# Patient Record
Sex: Female | Born: 1975 | Race: Asian | Hispanic: No | Marital: Married | Smoking: Never smoker
Health system: Southern US, Community
[De-identification: ages and names within clinical notes are randomized; demographics above are authoritative.]

## PROBLEM LIST (undated history)

## (undated) DIAGNOSIS — I10 Essential (primary) hypertension: Secondary | ICD-10-CM

## (undated) HISTORY — PX: OTHER SURGICAL HISTORY: SHX169

## (undated) HISTORY — DX: Essential (primary) hypertension: I10

---

## 2018-08-08 ENCOUNTER — Ambulatory Visit (HOSPITAL_COMMUNITY)
Admission: EM | Admit: 2018-08-08 | Discharge: 2018-08-08 | Disposition: A | Discharge status: Home / Self Care | Payer: Commercial Managed Care - PPO | Attending: Family Medicine | Admitting: Family Medicine

## 2018-08-08 ENCOUNTER — Other Ambulatory Visit: Payer: Self-pay

## 2018-08-08 ENCOUNTER — Encounter (HOSPITAL_COMMUNITY): Payer: Self-pay

## 2018-08-08 DIAGNOSIS — B356 Tinea cruris: Secondary | ICD-10-CM | POA: Diagnosis not present

## 2018-08-08 MED ORDER — FLUCONAZOLE 200 MG PO TABS: 200.0000 mg | ORAL_TABLET | ORAL | 0 refills | Status: AC

## 2018-08-08 MED ORDER — CLOTRIMAZOLE 1 % EX CREA: TOPICAL_CREAM | CUTANEOUS | 0 refills | Status: DC

## 2018-08-08 NOTE — Discharge Instructions (Signed)
Please take Diflucan once weekly for the next month Continue Lotrimin twice daily  Please keep area clean and dry  Follow-up if symptoms changing worsening, not improving with the above

## 2018-08-08 NOTE — ED Provider Notes (Signed)
[facility]    CSN: [id number] Arrival date & time: 08/08/18  1433     History   Chief Complaint Chief Complaint  Patient presents with  . Rash    HPI Alicia Wilkins is a 43 y.o. female no significant past medical history presenting today for evaluation of rash.  Patient states that she has had a rash around her groin area off and on for many years.  She states that over the past 2 months she has had worsening symptoms.  Rash is associated with itching and a burning discomfort.  She has been using Lotrimin topically without relief.  She notes that she previously was on fungal medicine.  She denies any fevers.  Denies dysuria or increased frequency.  Denies abnormal vaginal discharge.  Denies fevers, chills or body aches.  Denies rash elsewhere on body.  HPI  History reviewed. No pertinent past medical history.  There are no active problems to display for this patient.   Past Surgical History:  Procedure Laterality Date  . c-section      OB History   No obstetric history on file.      Home Medications    Prior to Admission medications   Medication Sig Start Date End Date Taking? Authorizing Provider  clotrimazole (LOTRIMIN) 1 % cream Apply to affected area 2 times daily 08/08/18   Arlone Lenhardt C, PA-C  fluconazole (DIFLUCAN) 200 MG tablet Take 1 tablet (200 mg total) by mouth once a week for 4 doses. 08/08/18 08/30/18  Shamica Moree, Junius CreamerHallie C, PA-C    Family History No family history on file.  Social History Social History   Tobacco Use  . Smoking status: Never Smoker  . Smokeless tobacco: Never Used  Substance Use Topics  . Alcohol use: Not Currently  . Drug use: Not Currently     Allergies   Patient has no known allergies.   Review of Systems Review of Systems  Constitutional: Negative for fatigue and fever.  HENT: Negative for mouth sores.   Eyes: Negative for visual disturbance.  Respiratory: Negative for shortness of breath.    Cardiovascular: Negative for chest pain.  Gastrointestinal: Negative for abdominal pain, nausea and vomiting.  Genitourinary: Negative for genital sores.  Musculoskeletal: Negative for arthralgias and joint swelling.  Skin: Positive for color change and rash. Negative for wound.  Neurological: Negative for dizziness, weakness, light-headedness and headaches.     Physical Exam Triage Vital Signs ED Triage Vitals  Enc Vitals Group     BP 08/08/18 1500 (!) 148/104     Pulse Rate 08/08/18 1500 (!) 56     Resp 08/08/18 1500 18     Temp 08/08/18 1500 98.7 F (37.1 C)     Temp Source 08/08/18 1500 Oral     SpO2 08/08/18 1500 100 %     Weight --      Height --      Head Circumference --      Peak Flow --      Pain Score 08/08/18 1501 0     Pain Loc --      Pain Edu? --      Excl. in GC? --    No data found.  Updated Vital Signs BP (!) 148/104 (BP Location: Right Arm)   Pulse (!) 56   Temp 98.7 F (37.1 C) (Oral)   Resp 18   LMP 08/01/2018   SpO2 100%   Visual Acuity Right Eye Distance:   Left Eye Distance:  Bilateral Distance:    Right Eye Near:   Left Eye Near:    Bilateral Near:     Physical Exam Vitals signs and nursing note reviewed.  Constitutional:      Appearance: She is well-developed.     Comments: No acute distress  HENT:     Head: Normocephalic and atraumatic.     Nose: Nose normal.  Eyes:     Conjunctiva/sclera: Conjunctivae normal.  Neck:     Musculoskeletal: Neck supple.  Cardiovascular:     Rate and Rhythm: Normal rate.  Pulmonary:     Effort: Pulmonary effort is normal. No respiratory distress.  Abdominal:     General: There is no distension.  Musculoskeletal: Normal range of motion.  Skin:    General: Skin is warm and dry.     Comments: Bilateral groin, proximal thighs and buttock area with hyperpigmented discoloration, mild erythema.  Increased erythema noted in genital region on mons.  Neurological:     Mental Status: She is alert  and oriented to person, place, and time.      UC Treatments / Results  Labs (all labs ordered are listed, but only abnormal results are displayed) Labs Reviewed - No data to display  EKG None  Radiology No results found.  Procedures Procedures (including critical care time)  Medications Ordered in UC Medications - No data to display  Initial Impression / Assessment and Plan / UC Course  I have reviewed the triage vital signs and the nursing notes.  Pertinent labs & imaging results that were available during my care of the patient were reviewed by me and considered in my medical decision making (see chart for details).     X-ray suggestive of tinea.  Will treat as such with Diflucan weekly x1 month, given currently using Lotrimin without relief..  Continue Lotrimin topically.  Discussed keeping area clean and dry.  Avoiding moisture that may exacerbate symptoms.  Continue to monitor,Discussed strict return precautions. Patient verbalized understanding and is agreeable with plan.  Final Clinical Impressions(s) / UC Diagnoses   Final diagnoses:  Tinea cruris     Discharge Instructions     Please take Diflucan once weekly for the next month Continue Lotrimin twice daily  Please keep area clean and dry  Follow-up if symptoms changing worsening, not improving with the above   ED Prescriptions    Medication Sig Dispense Auth. Provider   fluconazole (DIFLUCAN) 200 MG tablet Take 1 tablet (200 mg total) by mouth once a week for 4 doses. 4 tablet Ashlye Oviedo C, PA-C   clotrimazole (LOTRIMIN) 1 % cream Apply to affected area 2 times daily 40 g Isaia Hassell C, PA-C     Controlled Substance Prescriptions [place] Controlled Substance Registry consulted? Not Applicable   Lew Dawes, [place] 08/08/18 1609

## 2018-08-08 NOTE — ED Triage Notes (Signed)
Pt c/o rash to rt side of vaginal area around to buttocks for past 70months. States hx of the same.

## 2018-11-11 ENCOUNTER — Ambulatory Visit (HOSPITAL_COMMUNITY)
Admission: EM | Admit: 2018-11-11 | Discharge: 2018-11-11 | Disposition: A | Discharge status: Home / Self Care | Payer: Commercial Managed Care - PPO | Attending: Family Medicine | Admitting: Family Medicine

## 2018-11-11 ENCOUNTER — Encounter (HOSPITAL_COMMUNITY): Payer: Self-pay

## 2018-11-11 ENCOUNTER — Other Ambulatory Visit: Payer: Self-pay

## 2018-11-11 DIAGNOSIS — B356 Tinea cruris: Secondary | ICD-10-CM | POA: Diagnosis not present

## 2018-11-11 MED ORDER — KETOCONAZOLE 2 % EX CREA: 1.0000 "application " | TOPICAL_CREAM | Freq: Every day | CUTANEOUS | 1 refills | Status: DC

## 2018-11-11 MED ORDER — FLUCONAZOLE 200 MG PO TABS: 200.0000 mg | ORAL_TABLET | ORAL | 0 refills | Status: AC

## 2018-11-11 MED ORDER — NYSTATIN 100000 UNIT/GM EX POWD: Freq: Two times a day (BID) | CUTANEOUS | 1 refills | Status: DC

## 2018-11-11 NOTE — Discharge Instructions (Addendum)
Take the diflucan once a week for 4 weeks Apply the ketoconazole to the rash daily for 4 weeks After this use the powder daily to try to prevent recurrence

## 2018-11-11 NOTE — ED Triage Notes (Signed)
Patient presents to Urgent Care with complaints of itchy/ burning rash in her genital region since a month ago. Patient reports she had the same in May, and was given a cream that helped.

## 2018-11-11 NOTE — ED Provider Notes (Signed)
[facility]    CSN: [id number] Arrival date & time: 11/11/18  1515      History   Chief Complaint Chief Complaint  Patient presents with  . Rash    HPI Alicia Wilkins is a 43 y.o. female.   HPI  Patient was seen for tinea cruris in May.  She was pleased with the treatment previously.  She is unhappy that the rash keeps coming back.  She would like refills of her prior medication.  History reviewed. No pertinent past medical history.  There are no active problems to display for this patient.   Past Surgical History:  Procedure Laterality Date  . c-section      OB History   No obstetric history on file.      Home Medications    Prior to Admission medications   Medication Sig Start Date End Date Taking? Authorizing Provider  clotrimazole (LOTRIMIN) 1 % cream Apply to affected area 2 times daily 08/08/18   Wieters, Hallie C, PA-C  fluconazole (DIFLUCAN) 200 MG tablet Take 1 tablet (200 mg total) by mouth once a week for 4 doses. 11/11/18 12/03/18  Eustace MooreNelson, Yvonne Sue, MD  ketoconazole (NIZORAL) 2 % cream Apply 1 application topically daily. 11/11/18   Eustace MooreNelson, Yvonne Sue, MD  nystatin (MYCOSTATIN/NYSTOP) powder Apply topically 2 (two) times daily. 11/11/18   Eustace MooreNelson, Yvonne Sue, MD    Family History Family History  Problem Relation Age of Onset  . Healthy Mother   . Healthy Father     Social History Social History   Tobacco Use  . Smoking status: Never Smoker  . Smokeless tobacco: Never Used  Substance Use Topics  . Alcohol use: Not Currently  . Drug use: Not Currently     Allergies   Patient has no known allergies.   Review of Systems Review of Systems  Constitutional: Negative for chills and fever.  HENT: Negative for ear pain and sore throat.   Eyes: Negative for pain and visual disturbance.  Respiratory: Negative for cough and shortness of breath.   Cardiovascular: Negative for chest pain and palpitations.  Gastrointestinal:  Negative for abdominal pain and vomiting.  Genitourinary: Negative for dysuria and hematuria.  Musculoskeletal: Negative for arthralgias and back pain.  Skin: Positive for rash. Negative for color change.  Neurological: Negative for seizures and syncope.  All other systems reviewed and are negative.    Physical Exam Triage Vital Signs ED Triage Vitals  Enc Vitals Group     BP 11/11/18 1603 130/79     Pulse Rate 11/11/18 1603 63     Resp 11/11/18 1603 17     Temp 11/11/18 1603 98.8 F (37.1 C)     Temp Source 11/11/18 1603 Oral     SpO2 11/11/18 1603 100 %     Weight --      Height --      Head Circumference --      Peak Flow --      Pain Score 11/11/18 1640 5     Pain Loc --      Pain Edu? --      Excl. in GC? --    No data found.  Updated Vital Signs BP 130/79 (BP Location: Right Arm)   Pulse 63   Temp 98.8 F (37.1 C) (Oral)   Resp 17   SpO2 100%   Physical Exam Constitutional:      General: She is not in acute distress.    Appearance: She  is well-developed.  HENT:     Head: Normocephalic and atraumatic.  Eyes:     Conjunctiva/sclera: Conjunctivae normal.     Pupils: Pupils are equal, round, and reactive to light.  Neck:     Musculoskeletal: Normal range of motion.  Cardiovascular:     Rate and Rhythm: Normal rate.  Pulmonary:     Effort: Pulmonary effort is normal. No respiratory distress.  Abdominal:     General: There is no distension.     Palpations: Abdomen is soft.     Comments: Under abdominal pannus and across groin there is erythematous rash with clearing center and satellite lesions consistent with tinea cruris  Musculoskeletal: Normal range of motion.  Skin:    General: Skin is warm and dry.  Neurological:     Mental Status: She is alert.      UC Treatments / Results  Labs (all labs ordered are listed, but only abnormal results are displayed) Labs Reviewed - No data to display  EKG   Radiology No results found.  Procedures  Procedures (including critical care time)  Medications Ordered in UC Medications - No data to display  Initial Impression / Assessment and Plan / UC Course  I have reviewed the triage vital signs and the nursing notes.  Pertinent labs & imaging results that were available during my care of the patient were reviewed by me and considered in my medical decision making (see chart for details).     Medicines refilled.  We will add powder to use in between spells to see if this helps prevent.  Discussed keeping area dry Final Clinical Impressions(s) / UC Diagnoses   Final diagnoses:  Tinea cruris     Discharge Instructions     Take the diflucan once a week for 4 weeks Apply the ketoconazole to the rash daily for 4 weeks After this use the powder daily to try to prevent recurrence   ED Prescriptions    Medication Sig Dispense Auth. Provider   ketoconazole (NIZORAL) 2 % cream Apply 1 application topically daily. 30 g Raylene Everts, MD   fluconazole (DIFLUCAN) 200 MG tablet Take 1 tablet (200 mg total) by mouth once a week for 4 doses. 4 tablet Raylene Everts, MD   nystatin (MYCOSTATIN/NYSTOP) powder Apply topically 2 (two) times daily. 30 g Raylene Everts, MD     Controlled Substance Prescriptions [place] Controlled Substance Registry consulted? Not Applicable   Raylene Everts, MD 11/11/18 [id number]

## 2018-12-16 ENCOUNTER — Encounter (HOSPITAL_COMMUNITY): Payer: Self-pay | Admitting: Emergency Medicine

## 2018-12-16 ENCOUNTER — Ambulatory Visit (HOSPITAL_COMMUNITY)
Admission: EM | Admit: 2018-12-16 | Discharge: 2018-12-16 | Disposition: A | Discharge status: Home / Self Care | Payer: Commercial Managed Care - PPO | Attending: Emergency Medicine | Admitting: Emergency Medicine

## 2018-12-16 ENCOUNTER — Other Ambulatory Visit: Payer: Self-pay

## 2018-12-16 ENCOUNTER — Ambulatory Visit (HOSPITAL_COMMUNITY)
Admission: EM | Admit: 2018-12-16 | Discharge: 2018-12-16 | Disposition: A | Discharge status: Home / Self Care | Payer: Self-pay

## 2018-12-16 DIAGNOSIS — R21 Rash and other nonspecific skin eruption: Secondary | ICD-10-CM | POA: Diagnosis not present

## 2018-12-16 MED ORDER — FLUCONAZOLE 200 MG PO TABS: 200.0000 mg | ORAL_TABLET | ORAL | 0 refills | Status: AC

## 2018-12-16 NOTE — ED Triage Notes (Signed)
PT reports itching and burning in genital area. PT was seen and treated for same a month ago.

## 2018-12-16 NOTE — ED Provider Notes (Signed)
[place]    CSN: [id number] Arrival date & time: 12/16/18  1823      History   Chief Complaint Chief Complaint  Patient presents with  . Rash    Female provider please    HPI Alicia Wilkins is a 43 y.o. female.   Patient presents with recurrent rash on her left buttock.  She was seen here and treated on 08/08/2018 and again on 11/11/2018 for this rash; she was treated with Diflucan, ketoconazole cream, and nystatin powder.  Patient states when she stops taking the Diflucan the rash returns.  She denies vaginal discharge, vaginal rash, pelvic pain, abdominal pain, back pain, fever, or other symptoms.  The history is provided by the patient.    History reviewed. No pertinent past medical history.  There are no active problems to display for this patient.   Past Surgical History:  Procedure Laterality Date  . c-section      OB History   No obstetric history on file.      Home Medications    Prior to Admission medications   Medication Sig Start Date End Date Taking? Authorizing Provider  clotrimazole (LOTRIMIN) 1 % cream Apply to affected area 2 times daily 08/08/18  Yes Wieters, Hallie C, PA-C  ketoconazole (NIZORAL) 2 % cream Apply 1 application topically daily. 11/11/18  Yes Raylene Everts, MD  nystatin (MYCOSTATIN/NYSTOP) powder Apply topically 2 (two) times daily. 11/11/18  Yes Raylene Everts, MD  fluconazole (DIFLUCAN) 200 MG tablet Take 1 tablet (200 mg total) by mouth once a week for 4 doses. 12/16/18 01/07/19  Sharion Balloon, NP    Family History Family History  Problem Relation Age of Onset  . Healthy Mother   . Healthy Father     Social History Social History   Tobacco Use  . Smoking status: Never Smoker  . Smokeless tobacco: Never Used  Substance Use Topics  . Alcohol use: Not Currently  . Drug use: Not Currently     Allergies   Patient has no known allergies.   Review of Systems Review of Systems  Constitutional:  Negative for chills and fever.  HENT: Negative for ear pain and sore throat.   Eyes: Negative for pain and visual disturbance.  Respiratory: Negative for cough and shortness of breath.   Cardiovascular: Negative for chest pain and palpitations.  Gastrointestinal: Negative for abdominal pain and vomiting.  Genitourinary: Negative for dysuria and hematuria.  Musculoskeletal: Negative for arthralgias and back pain.  Skin: Positive for rash. Negative for color change.  Neurological: Negative for seizures and syncope.  All other systems reviewed and are negative.    Physical Exam Triage Vital Signs ED Triage Vitals  Enc Vitals Group     BP 12/16/18 1850 132/74     Pulse Rate 12/16/18 1848 (!) 51     Resp 12/16/18 1848 16     Temp 12/16/18 1848 97.6 F (36.4 C)     Temp Source 12/16/18 1848 Temporal     SpO2 12/16/18 1848 100 %     Weight --      Height --      Head Circumference --      Peak Flow --      Pain Score 12/16/18 1846 4     Pain Loc --      Pain Edu? --      Excl. in [place]? --    No data found.  Updated Vital Signs BP 132/74  Pulse (!) 51   Temp 97.6 F (36.4 C) (Temporal)   Resp 16   LMP 12/07/2018   SpO2 100%   Visual Acuity Right Eye Distance:   Left Eye Distance:   Bilateral Distance:    Right Eye Near:   Left Eye Near:    Bilateral Near:     Physical Exam Vitals signs and nursing note reviewed.  Constitutional:      General: She is not in acute distress.    Appearance: She is well-developed.  HENT:     Head: Normocephalic and atraumatic.     Mouth/Throat:     Mouth: Mucous membranes are moist.     Pharynx: Oropharynx is clear.  Eyes:     Conjunctiva/sclera: Conjunctivae normal.  Neck:     Musculoskeletal: Neck supple.  Cardiovascular:     Rate and Rhythm: Normal rate and regular rhythm.     Heart sounds: No murmur.  Pulmonary:     Effort: Pulmonary effort is normal. No respiratory distress.     Breath sounds: Normal breath sounds.   Abdominal:     General: Bowel sounds are normal.     Palpations: Abdomen is soft.     Tenderness: There is no abdominal tenderness. There is no right CVA tenderness, left CVA tenderness, guarding or rebound.  Skin:    General: Skin is warm and dry.     Findings: Rash present.     Comments: Faint erythematous circular rash on left buttock and left upper posterior thigh.  No drainage or streaks.   Neurological:     General: No focal deficit present.     Mental Status: She is alert and oriented to person, place, and time.      UC Treatments / Results  Labs (all labs ordered are listed, but only abnormal results are displayed) Labs Reviewed - No data to display  EKG   Radiology No results found.  Procedures Procedures (including critical care time)  Medications Ordered in UC Medications - No data to display  Initial Impression / Assessment and Plan / UC Course  I have reviewed the triage vital signs and the nursing notes.  Pertinent labs & imaging results that were available during my care of the patient were reviewed by me and considered in my medical decision making (see chart for details).    Rash on left buttock and left upper posterior thigh.  Treating with Diflucan weekly x4 weeks.  Instructed patient to continue with the nystatin powder and ketoconazole cream.  Instructed patient to follow-up with a dermatologist since the rash is recurrent.  Also instructed her to establish a primary care provider and suggestion given.  Patient agrees to plan of care.     Final Clinical Impressions(s) / UC Diagnoses   Final diagnoses:  Rash     Discharge Instructions     Take the prescribed Diflucan once a week for 4 weeks.  Call the dermatologist listed below or your preferred dermatologist to schedule an appointment to be seen for this recurrent rash.    Establish a primary care provider such as the one listed below.    ED Prescriptions    Medication Sig Dispense Auth.  Provider   fluconazole (DIFLUCAN) 200 MG tablet Take 1 tablet (200 mg total) by mouth once a week for 4 doses. 4 tablet Mickie Bail, NP     PDMP not reviewed this encounter.   Mickie Bail, NP 12/16/18 [phone]

## 2018-12-16 NOTE — Discharge Instructions (Addendum)
Take the prescribed Diflucan once a week for 4 weeks.  Call the dermatologist listed below or your preferred dermatologist to schedule an appointment to be seen for this recurrent rash.    Establish a primary care provider such as the one listed below.

## 2019-01-27 ENCOUNTER — Telehealth: Payer: Self-pay

## 2019-01-27 NOTE — Telephone Encounter (Signed)
NOTES ON FILE FROM [facility] [phone], SENT REFERRAL TO SCHEDULING

## 2019-01-30 ENCOUNTER — Emergency Department (HOSPITAL_COMMUNITY)
Admission: EM | Admit: 2019-01-30 | Discharge: 2019-01-31 | Disposition: A | Discharge status: Home / Self Care | Payer: Commercial Managed Care - PPO | Attending: Emergency Medicine | Admitting: Emergency Medicine

## 2019-01-30 ENCOUNTER — Other Ambulatory Visit: Payer: Self-pay

## 2019-01-30 DIAGNOSIS — Z79899 Other long term (current) drug therapy: Secondary | ICD-10-CM | POA: Diagnosis not present

## 2019-01-30 DIAGNOSIS — H81399 Other peripheral vertigo, unspecified ear: Secondary | ICD-10-CM

## 2019-01-30 DIAGNOSIS — R110 Nausea: Secondary | ICD-10-CM

## 2019-01-30 LAB — COMPREHENSIVE METABOLIC PANEL
ALT: 17 U/L (ref 0–44)
AST: 25 U/L (ref 15–41)
Albumin: 4.1 g/dL (ref 3.5–5.0)
Alkaline Phosphatase: 74 U/L (ref 38–126)
Anion gap: 9 (ref 5–15)
BUN: 9 mg/dL (ref 6–20)
CO2: 25 mmol/L (ref 22–32)
Calcium: 9.3 mg/dL (ref 8.9–10.3)
Chloride: 107 mmol/L (ref 98–111)
Creatinine, Ser: 0.78 mg/dL (ref 0.44–1.00)
GFR calc Af Amer: >60 mL/min (ref >60)
GFR calc non Af Amer: >60 mL/min (ref >60)
Glucose, Bld: 112 mg/dL — ABNORMAL HIGH (ref 70–99)
Potassium: 3.8 mmol/L (ref 3.5–5.1)
Sodium: 141 mmol/L (ref 135–145)
Total Bilirubin: 0.5 mg/dL (ref 0.3–1.2)
Total Protein: 7.9 g/dL (ref 6.5–8.1)

## 2019-01-30 LAB — URINALYSIS, ROUTINE W REFLEX MICROSCOPIC
Bacteria, UA: NONE SEEN
Bilirubin Urine: NEGATIVE
Glucose, UA: NEGATIVE mg/dL
Ketones, ur: NEGATIVE mg/dL
Nitrite: NEGATIVE
Protein, ur: NEGATIVE mg/dL
RBC / HPF: >50 RBC/hpf — ABNORMAL HIGH (ref 0–5)
Specific Gravity, Urine: 1.015 (ref 1.005–1.030)
pH: 5 (ref 5.0–8.0)

## 2019-01-30 LAB — LIPASE, BLOOD: Lipase: 33 U/L (ref 11–51)

## 2019-01-30 LAB — CBC
HCT: 34.8 % — ABNORMAL LOW (ref 36.0–46.0)
Hemoglobin: 11.2 g/dL — ABNORMAL LOW (ref 12.0–15.0)
MCH: 27.7 pg (ref 26.0–34.0)
MCHC: 32.2 g/dL (ref 30.0–36.0)
MCV: 85.9 fL (ref 80.0–100.0)
Platelets: 262 10*3/uL (ref 150–400)
RBC: 4.05 MIL/uL (ref 3.87–5.11)
RDW: 13.3 % (ref 11.5–15.5)
WBC: 7.6 10*3/uL (ref 4.0–10.5)
nRBC: 0 % (ref 0.0–0.2)

## 2019-01-30 LAB — I-STAT BETA HCG BLOOD, ED (MC, WL, AP ONLY): I-stat hCG, quantitative: <5 m[IU]/mL (ref <5)

## 2019-01-30 MED ORDER — PANTOPRAZOLE SODIUM 40 MG PO TBEC: 40.0000 mg | DELAYED_RELEASE_TABLET | Freq: Every day | ORAL | 0 refills | Status: DC

## 2019-01-30 MED ORDER — PROMETHAZINE HCL 25 MG PO TABS: 25.0000 mg | ORAL_TABLET | Freq: Three times a day (TID) | ORAL | 0 refills | Status: DC | PRN

## 2019-01-30 NOTE — ED Triage Notes (Addendum)
Patient states she has been having nausea, she has been seen at [facility] last week and they dx her with GERD, they gave her Zofran but it does not work that well for her symptoms. She went to [facility] and got some Phenergan, but she is about out. She dies any vomiting or diarrhea. Does endorse some dizziness, she said she has been eating and drinking like normally.

## 2019-01-30 NOTE — ED Provider Notes (Signed)
[facility]-EMERGENCY DEPT Provider Note   CSN: [id number] Arrival date & time: 01/30/19  1918     History   Chief Complaint Chief Complaint  Patient presents with   Nausea    HPI Alicia Wilkins is a 43 y.o. female with history of GERD      HPI  Patient presents for 12 days of nausea that is intermittent, worse in the mornings and associated with vertigo. Patient denies diarrhea, fever, headache, vision changes, no changes in bowel movements.  Patient denies chest pain, increased thirst, increased urination, alcohol use, drug use, vomiting or cough.   Patient states that she feels nauseous typically in the morning for 1 to 2 hours and during that time will feel that the room is moving when she attempts to walk.  However patient states that over the past 2 days she has had vertigo that is preventing her from getting around her house. States she is currently symptom free. However when she looks left she has occasional symptoms.   Denies any recent illness, sick contact.   No past medical history on file.  There are no active problems to display for this patient.   Past Surgical History:  Procedure Laterality Date   c-section       OB History   No obstetric history on file.      Home Medications    Prior to Admission medications   Medication Sig Start Date End Date Taking? Authorizing Provider  ondansetron (ZOFRAN) 4 MG tablet Take 4 mg by mouth every 8 (eight) hours as needed for nausea or vomiting.   Yes [provider]  clotrimazole (LOTRIMIN) 1 % cream Apply to affected area 2 times daily Patient not taking: Reported on 01/30/2019 08/08/18   Wieters, Hallie C, PA-C  ketoconazole (NIZORAL) 2 % cream Apply 1 application topically daily. Patient not taking: Reported on 01/30/2019 11/11/18   Eustace MooreNelson, Yvonne Sue, MD  meclizine (ANTIVERT) 25 MG tablet Take 1 tablet (25 mg total) by mouth 3 (three) times daily as needed for  dizziness. 01/31/19   Gailen ShelterFondaw, Akeia Perot S, PA  nystatin (MYCOSTATIN/NYSTOP) powder Apply topically 2 (two) times daily. Patient not taking: Reported on 01/30/2019 11/11/18   Eustace MooreNelson, Yvonne Sue, MD  ondansetron (ZOFRAN) 4 MG tablet Take 1 tablet (4 mg total) by mouth every 6 (six) hours for 10 days. 01/31/19 02/10/19  Gailen ShelterFondaw, Locke Barrell S, PA  pantoprazole (PROTONIX) 40 MG tablet Take 1 tablet (40 mg total) by mouth daily for 10 days. 01/30/19 02/09/19  Gailen ShelterFondaw, Makenzy Krist S, PA  promethazine (PHENERGAN) 25 MG tablet Take 1 tablet (25 mg total) by mouth every 8 (eight) hours as needed for up to 10 days for nausea or vomiting. 01/30/19 02/09/19  Gailen ShelterFondaw, Todd Jelinski S, PA    Family History Family History  Problem Relation Age of Onset   Healthy Mother    Healthy Father     Social History Social History   Tobacco Use   Smoking status: Never Smoker   Smokeless tobacco: Never Used  Substance Use Topics   Alcohol use: Not Currently   Drug use: Not Currently     Allergies   Patient has no known allergies.   Review of Systems Review of Systems  Constitutional: Negative for chills and fever.  HENT: Negative for hearing loss.   Eyes: Negative for pain.  Respiratory: Negative for shortness of breath.   Cardiovascular: Negative for chest pain and leg swelling.  Gastrointestinal: Positive for nausea. Negative for abdominal  pain and vomiting.  Genitourinary: Negative for dysuria.  Musculoskeletal: Negative for myalgias.  Skin: Negative for rash.  Neurological: Positive for dizziness and light-headedness. Negative for headaches.     Physical Exam Updated Vital Signs BP (!) 155/98    Pulse (!) 58    Temp 98.2 F (36.8 C) (Oral)    Resp 18    LMP 01/29/2019    SpO2 100%   Physical Exam Vitals signs and nursing note reviewed.  Constitutional:      General: She is not in acute distress.    Appearance: She is not ill-appearing or diaphoretic.     Comments: Patient is well-appearing, no acute  distress, pleasant, answers all questions appropriately and follows commands  HENT:     Head: Normocephalic and atraumatic.     Nose: Nose normal.     Mouth/Throat:     Mouth: Mucous membranes are moist.  Eyes:     General: No scleral icterus.    Extraocular Movements: Extraocular movements intact.     Pupils: Pupils are equal, round, and reactive to light.     Comments: Leftward gaze induces nausea.  Very slight, leftward horizontal nystagmus with leftward gaze.  Neck:     Musculoskeletal: Normal range of motion. No neck rigidity.  Cardiovascular:     Rate and Rhythm: Normal rate and regular rhythm.     Pulses: Normal pulses.     Heart sounds: Normal heart sounds.     Comments: Regular rate and rhythm, no murmurs rubs or gallops  HR does not change with standing Pulmonary:     Effort: Pulmonary effort is normal. No respiratory distress.     Breath sounds: No wheezing.  Abdominal:     General: Abdomen is flat. Bowel sounds are normal.     Palpations: Abdomen is soft.     Tenderness: There is no abdominal tenderness.  Musculoskeletal:     Right lower leg: No edema.     Left lower leg: No edema.     Comments: Patient has 5/5 strength bilateral upper and lower extremities.   Skin:    General: Skin is warm and dry.     Capillary Refill: Capillary refill takes less than 2 seconds.     Comments: Patient is not pale or diaphoretic  Neurological:     Mental Status: She is alert. Mental status is at baseline.     Comments: Alert and oriented to self, place, time and event.  Speech is fluent, clear without dysarthria or dysphasia.  Strength 5/5 in upper/lower extremities  Sensation intact in upper/lower extremities  Normal gait. Normal finger-to-nose and feet tapping.  CN I not tested  CN II grossly intact visual fields bilaterally. Did not visualize posterior eye.   CN III, IV, VI PERRLA and EOMs intact bilaterally  CN V Intact sensation to sharp and light touch to the face  CN  VII facial movements symmetric  CN VIII not tested  CN IX, X no uvula deviation, symmetric rise of soft palate  CN XI 5/5 SCM and trapezius strength bilaterally  CN XII Midline tongue protrusion, symmetric L/R movements   Psychiatric:        Mood and Affect: Mood normal.        Behavior: Behavior normal.      ED Treatments / Results  Labs (all labs ordered are listed, but only abnormal results are displayed) Labs Reviewed  COMPREHENSIVE METABOLIC PANEL - Abnormal; Notable for the following components:  Result Value   Glucose, Bld 112 (*)    All other components within normal limits  CBC - Abnormal; Notable for the following components:   Hemoglobin 11.2 (*)    HCT 34.8 (*)    All other components within normal limits  URINALYSIS, ROUTINE W REFLEX MICROSCOPIC - Abnormal; Notable for the following components:   APPearance HAZY (*)    Hgb urine dipstick LARGE (*)    Leukocytes,Ua TRACE (*)    RBC / HPF >50 (*)    All other components within normal limits  LIPASE, BLOOD  I-STAT BETA HCG BLOOD, ED ([facility], [facility], [facility] ONLY)    EKG EKG Interpretation  Date/Time:  Saturday January 31 2019 00:13:54 EST Ventricular Rate:  54 PR Interval:    QRS Duration: 85 QT Interval:  437 QTC Calculation: 415 R Axis:   43 Text Interpretation: Sinus rhythm Baseline wander in lead(s) V4 No old tracing to compare Confirmed by Ward, Baxter Hire [phone]) on 01/31/2019 12:23:20 AM   Radiology No results found.  Procedures Procedures (including critical care time)  Medications Ordered in ED Medications - No data to display   Initial Impression / Assessment and Plan / ED Course  I have reviewed the triage vital signs and the nursing notes.  Pertinent labs & imaging results that were available during my care of the patient were reviewed by me and considered in my medical decision making (see chart for details).       Patient presents symptom-free but with symptoms of nausea and lightheadedness  that occurs intermittently worse in the mornings worsened by looking left.  She is well-appearing and has no symptoms currently.  Patient has normal vitals other than elevated blood pressure.  Patient has benign abdominal exam low concern for abdominal pathology causing her nausea.  Patient denies headache, fever, vertiginous symptoms, denies visual changes and has no focal neuro deficits--for this reason I doubt that there is a neurologic cause of patient's symptoms.   CBC and CMP abnormal finding to describe symptoms.  Lipase within normal limits.  Patient has negative hCG pregnancy test. UA with RBCs--patient with mildly low HCT likely due to patient currently on menstrual period.   No history of DM or historical features to indicate patient has diabetes.  Patient has any drug use doubt hyperemesis as a result of THC use.   Patient otherwise has reassuring physical exam and history.  Suspect that patient's nausea is due to peripheral vertigo potentially BPPV or vestibular neuritis; BPPV more likely as patient symptoms are intermittent and appear to be worse at times at night likely when she is moving to the affected side.  However because nausea is intermittent and mild and has been assessed by multiple providers will refill her Phenergan and encouraged her to follow-up with her PCP. Will also keep her cardiology appointment.  Patient given resources for ENT follow-up if symptoms persist.  Given strict return precautions.    I discussed this case with my attending physician Dr. Elesa Massed including patient's presenting symptoms, physical exam, and planned diagnostics and interventions. Attending physician stated agreement with plan or made changes to plan which were implemented. Dr. Elesa Massed recommended zofran over phenergan. Zofran prescribed. Discussed with patient not to fill prescription for phenergan.   Attending physician assessed patient at bedside.      The patient appears reasonably screened  and/or stabilized for discharge and I doubt any other medical condition or other [facility] requiring further screening, evaluation, or treatment in the ED at this  time prior to discharge.  Patient is hemodynamically stable, in NAD, and able to ambulate in the ED. Pain has been managed or a plan has been made for home management and has no complaints prior to discharge. Patient is comfortable with above plan and is stable for discharge at this time. All questions were answered prior to disposition. Results from the ER workup discussed with the patient face to face and all questions answered to the best of my ability. The patient is safe for discharge with strict return precautions. Patient appears safe for discharge with appropriate follow-up.  Conveyed my impression with the patient and he voiced understanding and is agreeable to plan.   An After Visit Summary was printed and given to the patient.  Portions of this note were generated with Scientist, clinical (histocompatibility and immunogenetics). Dictation errors may occur despite best attempts at proofreading.    Final Clinical Impressions(s) / ED Diagnoses   Final diagnoses:  Nausea without vomiting  Peripheral vertigo, unspecified laterality    ED Discharge Orders         Ordered    meclizine (ANTIVERT) 25 MG tablet  3 times daily PRN     01/31/19 0026    ondansetron (ZOFRAN) 4 MG tablet  Every 6 hours     01/31/19 0041    promethazine (PHENERGAN) 25 MG tablet  Every 8 hours PRN     01/30/19 2355    pantoprazole (PROTONIX) 40 MG tablet  Daily     01/30/19 2355           Solon Augusta Eloy, [place] 01/31/19 0115    Linwood Dibbles, MD 02/01/19 1123

## 2019-01-31 MED ORDER — MECLIZINE HCL 25 MG PO TABS: 25.0000 mg | ORAL_TABLET | Freq: Three times a day (TID) | ORAL | 0 refills | Status: DC | PRN

## 2019-01-31 MED ORDER — ONDANSETRON HCL 4 MG PO TABS: 4.0000 mg | ORAL_TABLET | Freq: Four times a day (QID) | ORAL | 0 refills | Status: AC

## 2019-01-31 NOTE — ED Provider Notes (Signed)
Medical screening examination/treatment/procedure(s) were conducted as a shared visit with non-physician practitioner(s) and myself.  I personally evaluated the patient during the encounter.  EKG Interpretation  Date/Time:  Saturday January 31 2019 00:13:54 EST Ventricular Rate:  54 PR Interval:    QRS Duration: 85 QT Interval:  437 QTC Calculation: 415 R Axis:   43 Text Interpretation: Sinus rhythm Baseline wander in lead(s) V4 No old tracing to compare Confirmed by Aviya Jarvie, Cyril Mourning [phone]) on 01/31/2019 12:23:20 AM  Patient is a 43 year old healthy female who presents to the emergency department with 2 weeks of feeling she is off balance, having spinning sensation, nausea without vomiting.  No chest pain, shortness of breath, abdominal pain, diarrhea.  No numbness or focal weakness.  Symptoms worse with looking to the left.  Zofran, Phenergan previously helping but still having symptoms.  Suspect peripheral vertigo.  Doubt CVA.  Recommended meclizine, Zofran, PCP follow-up.  If symptoms or not improving, will give ENT follow-up information.  Discussed with patient that this is usually a self-limiting process.   Jalayna Josten, Delice Bison, DO 01/31/19 [phone]

## 2019-01-31 NOTE — Discharge Instructions (Signed)
I refilled your Phenergan and Protonix.  Please take as prescribed.  Please follow-up with your primary care physician and keep your appointment with cardiology. You may follow up with otolaryngology if symptoms persist.

## 2019-02-12 ENCOUNTER — Other Ambulatory Visit: Payer: Self-pay | Admitting: Otolaryngology

## 2019-02-12 DIAGNOSIS — R42 Dizziness and giddiness: Secondary | ICD-10-CM

## 2019-02-12 DIAGNOSIS — R2689 Other abnormalities of gait and mobility: Secondary | ICD-10-CM

## 2019-02-12 DIAGNOSIS — R110 Nausea: Secondary | ICD-10-CM

## 2019-02-16 ENCOUNTER — Other Ambulatory Visit: Payer: Self-pay | Admitting: Family Medicine

## 2019-02-16 DIAGNOSIS — Z1231 Encounter for screening mammogram for malignant neoplasm of breast: Secondary | ICD-10-CM

## 2019-02-18 ENCOUNTER — Other Ambulatory Visit: Payer: Self-pay

## 2019-02-18 ENCOUNTER — Ambulatory Visit (INDEPENDENT_AMBULATORY_CARE_PROVIDER_SITE_OTHER): Payer: Commercial Managed Care - PPO | Admitting: Cardiovascular Disease

## 2019-02-18 ENCOUNTER — Encounter: Payer: Self-pay | Admitting: Cardiovascular Disease

## 2019-02-18 VITALS — BP 128/88 | HR 63 | Ht 65.0 in | Wt 178.2 lb

## 2019-02-18 DIAGNOSIS — R001 Bradycardia, unspecified: Secondary | ICD-10-CM | POA: Diagnosis not present

## 2019-02-18 DIAGNOSIS — R0602 Shortness of breath: Secondary | ICD-10-CM | POA: Diagnosis not present

## 2019-02-18 NOTE — Patient Instructions (Signed)
Medication Instructions:  No changes *If you need a refill on your cardiac medications before your next appointment, please call your pharmacy*  Lab Work: None ordered If you have labs (blood work) drawn today and your tests are completely normal, you will receive your results only by: Marland Kitchen MyChart Message (if you have MyChart) OR . A paper copy in the mail If you have any lab test that is abnormal or we need to change your treatment, we will call you to review the results.  Testing/Procedures: Your physician has requested that you have an echocardiogram. Echocardiography is a painless test that uses sound waves to create images of your heart. It provides your doctor with information about the size and shape of your heart and how well your heart's chambers and valves are working. You may receive an ultrasound enhancing agent through an IV if needed to better visualize your heart during the echo.This procedure takes approximately one hour. There are no restrictions for this procedure. This will take place at the [address].   ZIO XT- Long Term Monitor Instructions   Your physician has requested you wear your ZIO patch monitor_______days.   This is a single patch monitor.  [employer] supplies one patch monitor per enrollment.  Additional stickers are not available.   Please do not apply patch if you will be having a Nuclear Stress Test, Echocardiogram, Cardiac CT, MRI, or Chest Xray during the time frame you would be wearing the monitor. The patch cannot be worn during these tests.  You cannot remove and re-apply the ZIO XT patch monitor.   Your ZIO patch monitor will be sent USPS Priority mail from [facility] directly to your home address. The monitor may also be mailed to a PO BOX if home delivery is not available.   It may take 3-5 days to receive your monitor after you have been enrolled.   Once you have received you monitor, please review enclosed instructions.  Your  monitor has already been registered assigning a specific monitor serial # to you.   Applying the monitor   Shave hair from upper left chest.   Hold abrader disc by orange tab.  Rub abrader in 40 strokes over left upper chest as indicated in your monitor instructions.   Clean area with 4 enclosed alcohol pads .  Use all pads to assure are is cleaned thoroughly.  Let dry.   Apply patch as indicated in monitor instructions.  Patch will be place under collarbone on left side of chest with arrow pointing upward.   Rub patch adhesive wings for 2 minutes.Remove white label marked "1".  Remove white label marked "2".  Rub patch adhesive wings for 2 additional minutes.   While looking in a mirror, press and release button in center of patch.  A small green light will flash 3-4 times .  This will be your only indicator the monitor has been turned on.     Do not shower for the first 24 hours.  You may shower after the first 24 hours.   Press button if you feel a symptom. You will hear a small click.  Record Date, Time and Symptom in the Patient Log Book.   When you are ready to remove patch, follow instructions on last 2 pages of Patient Log Book.  Stick patch monitor onto last page of Patient Log Book.   Place Patient Log Book in [place] box.  Use locking tab on box  and tape box closed securely.  The [employer] and [employer] has [employer] on it.  Please place in mailbox as soon as possible.  Your physician should have your test results approximately 7 days after the monitor has been mailed back to [facility].   Call Violet at [phone] if you have questions regarding your ZIO XT patch monitor.  Call them immediately if you see an orange light blinking on your monitor.   If your monitor falls off in less than 4 days contact our Monitor department at [phone].  If your monitor becomes loose or falls off after 4 days call [employer] at [phone] for  suggestions on securing your monitor.    Follow-Up: At [facility], you and your health needs are our priority.  As part of our continuing mission to provide you with exceptional heart care, we have created designated Provider Care Teams.  These Care Teams include your primary Cardiologist (physician) and Advanced Practice Providers (APPs -  Physician Assistants and Nurse Practitioners) who all work together to provide you with the care you need, when you need it.  Your next appointment:   Follow up in 6-8 weeks with Ancil Linsey, MD

## 2019-02-18 NOTE — Assessment & Plan Note (Signed)
Alicia Wilkins is a 43 year old female referred to me by Dr. Sherrlyn Hock  for slow heart rate which was noticed while she was in the hospital for nausea and vomiting, and vertigo.  Her heart rate is 63 today.  I am going to get a 2-week Zio patch to further evaluate

## 2019-02-18 NOTE — Progress Notes (Signed)
02/18/2019 Alicia Wilkins   [birth year]  [id number]  Primary Physician System, Pcp Not In Primary Cardiologist: Alicia Gess MD Alicia Wilkins, [place], [place]  HPI:  Alicia Wilkins is a 43 y.o. thin appearing married female mother of 2 children who does not work and was referred by Dr. Celene Wilkins for slow heart rate.  She has no cardiac risk factors.  She is never had heart attack or stroke.  There is no family history of heart disease.  She was recently in the hospital for nausea vomiting and vertigo and apparently was noted to be bradycardic.  She is no longer bradycardic.  She does complain of some dyspnea on exertion however.   Current Meds  Medication Sig  . ketoconazole (NIZORAL) 2 % cream Apply 1 application topically daily.  . meclizine (ANTIVERT) 25 MG tablet Take 1 tablet (25 mg total) by mouth 3 (three) times daily as needed for dizziness.  . nystatin (MYCOSTATIN/NYSTOP) powder Apply topically 2 (two) times daily.  . ondansetron (ZOFRAN) 4 MG tablet Take 4 mg by mouth every 8 (eight) hours as needed for nausea or vomiting.  . promethazine (PHENERGAN) 25 MG tablet Take 1 tablet (25 mg total) by mouth every 8 (eight) hours as needed for up to 10 days for nausea or vomiting.     No Known Allergies  Social History   Socioeconomic History  . Marital status: Married    Spouse name: Not on file  . Number of children: Not on file  . Years of education: Not on file  . Highest education level: Not on file  Occupational History  . Not on file  Social Needs  . Financial resource strain: Not on file  . Food insecurity    Worry: Not on file    Inability: Not on file  . Transportation needs    Medical: Not on file    Non-medical: Not on file  Tobacco Use  . Smoking status: Never Smoker  . Smokeless tobacco: Never Used  Substance and Sexual Activity  . Alcohol use: Not Currently  . Drug use: Not Currently  . Sexual activity: Not on file  Lifestyle  .  Physical activity    Days per week: Not on file    Minutes per session: Not on file  . Stress: Not on file  Relationships  . Social Musician on phone: Not on file    Gets together: Not on file    Attends religious service: Not on file    Active member of club or organization: Not on file    Attends meetings of clubs or organizations: Not on file    Relationship status: Not on file  . Intimate partner violence    Fear of current or ex partner: Not on file    Emotionally abused: Not on file    Physically abused: Not on file    Forced sexual activity: Not on file  Other Topics Concern  . Not on file  Social History Narrative  . Not on file     Review of Systems: General: negative for chills, fever, night sweats or weight changes.  Cardiovascular: negative for chest pain, dyspnea on exertion, edema, orthopnea, palpitations, paroxysmal nocturnal dyspnea or shortness of breath Dermatological: negative for rash Respiratory: negative for cough or wheezing Urologic: negative for hematuria Abdominal: negative for nausea, vomiting, diarrhea, bright red blood per rectum, melena, or hematemesis Neurologic: negative for visual changes, syncope, or dizziness All other  systems reviewed and are otherwise negative except as noted above.    Blood pressure 128/88, pulse 63, height 5\' 5"  (1.651 m), weight 178 lb 3.2 oz (80.8 kg), last menstrual period 01/29/2019, SpO2 99 %.  General appearance: alert and no distress Neck: no adenopathy, no carotid bruit, no JVD, supple, symmetrical, trachea midline and thyroid not enlarged, symmetric, no tenderness/mass/nodules Lungs: clear to auscultation bilaterally Heart: regular rate and rhythm, S1, S2 normal, no murmur, click, rub or gallop Extremities: extremities normal, atraumatic, no cyanosis or edema Pulses: 2+ and symmetric Skin: Skin color, texture, turgor normal. No rashes or lesions Neurologic: Alert and oriented X 3, normal strength  and tone. Normal symmetric reflexes. Normal coordination and gait  EKG not performed today  ASSESSMENT AND PLAN:   Sinus bradycardia [name] is a 43 year old female referred to me by Dr. Sherrlyn Wilkins  for slow heart rate which was noticed while she was in the hospital for nausea and vomiting, and vertigo.  Her heart rate is 63 today.  I am going to get a 2-week Zio patch to further evaluate  Shortness of breath Patient complains of dyspnea exertion.  She has no cardiac risk factors.  She is never smoked.  We will get a 2D echo to further evaluate      Alicia Harp MD [facility], [facility] 02/18/2019 5:00 PM

## 2019-02-18 NOTE — Assessment & Plan Note (Signed)
Patient complains of dyspnea exertion.  She has no cardiac risk factors.  She is never smoked.  We will get a 2D echo to further evaluate

## 2019-02-26 ENCOUNTER — Ambulatory Visit (HOSPITAL_COMMUNITY): Payer: Commercial Managed Care - PPO | Attending: Cardiovascular Disease

## 2019-02-26 ENCOUNTER — Other Ambulatory Visit: Payer: Self-pay

## 2019-02-26 DIAGNOSIS — R0602 Shortness of breath: Secondary | ICD-10-CM | POA: Insufficient documentation

## 2019-02-27 ENCOUNTER — Telehealth: Payer: Self-pay | Admitting: Radiology

## 2019-02-27 NOTE — Telephone Encounter (Signed)
Enrolled patient for a 14 day Zio monitor to be mailed to patients home.  

## 2019-03-07 ENCOUNTER — Ambulatory Visit
Admission: RE | Admit: 2019-03-07 | Discharge: 2019-03-07 | Disposition: A | Discharge status: Home / Self Care | Payer: Commercial Managed Care - PPO | Source: Ambulatory Visit | Attending: Otolaryngology | Admitting: Otolaryngology

## 2019-03-07 ENCOUNTER — Other Ambulatory Visit: Payer: Self-pay

## 2019-03-07 DIAGNOSIS — R42 Dizziness and giddiness: Secondary | ICD-10-CM

## 2019-03-07 DIAGNOSIS — R110 Nausea: Secondary | ICD-10-CM

## 2019-03-07 DIAGNOSIS — R2689 Other abnormalities of gait and mobility: Secondary | ICD-10-CM

## 2019-03-07 MED ORDER — GADOBENATE DIMEGLUMINE 529 MG/ML IV SOLN
16.0000 mL | Freq: Once | INTRAVENOUS | Status: AC | PRN
  Administered 2019-03-07: 16 mL via INTRAVENOUS

## 2019-03-09 ENCOUNTER — Other Ambulatory Visit (INDEPENDENT_AMBULATORY_CARE_PROVIDER_SITE_OTHER): Payer: Commercial Managed Care - PPO

## 2019-03-09 DIAGNOSIS — R001 Bradycardia, unspecified: Secondary | ICD-10-CM | POA: Diagnosis not present

## 2019-04-10 ENCOUNTER — Ambulatory Visit
Admission: RE | Admit: 2019-04-10 | Discharge: 2019-04-10 | Disposition: A | Discharge status: Home / Self Care | Payer: Commercial Managed Care - PPO | Source: Ambulatory Visit | Attending: Family Medicine | Admitting: Family Medicine

## 2019-04-10 ENCOUNTER — Other Ambulatory Visit: Payer: Self-pay

## 2019-04-10 DIAGNOSIS — Z1231 Encounter for screening mammogram for malignant neoplasm of breast: Secondary | ICD-10-CM

## 2019-04-28 ENCOUNTER — Ambulatory Visit: Payer: Commercial Managed Care - PPO | Admitting: Cardiovascular Disease

## 2021-07-24 IMAGING — MR MR BRAIN/IAC WO/W CM
11 of 12 series · 38 of 48 positions shown · IV contrast (multihance)
Comparison: None.

CLINICAL DATA: Dizziness for 1 and half months.

EXAM:
MRI HEAD WITHOUT AND WITH CONTRAST
TECHNIQUE: Multiplanar, multiecho pulse sequences of the brain and surrounding
structures were obtained without and with intravenous contrast.
CONTRAST:  16mL MULTIHANCE GADOBENATE DIMEGLUMINE 529 MG/ML IV SOLN

[Series 2: T1 · sagittal · 5.0mm · 0.45mm/px · 4 of 23 slices shown (1 of 3)]
[im 1/23]
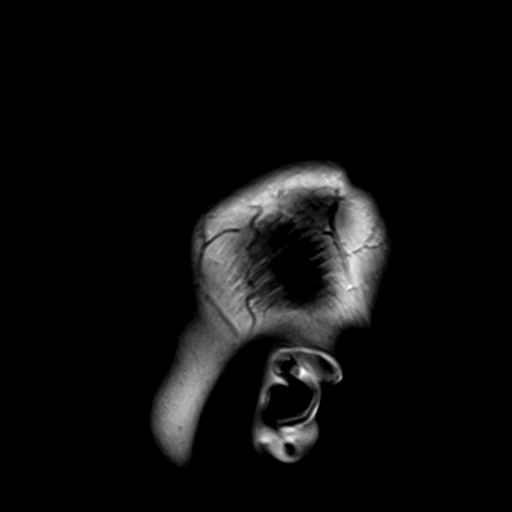
[im 8/23]
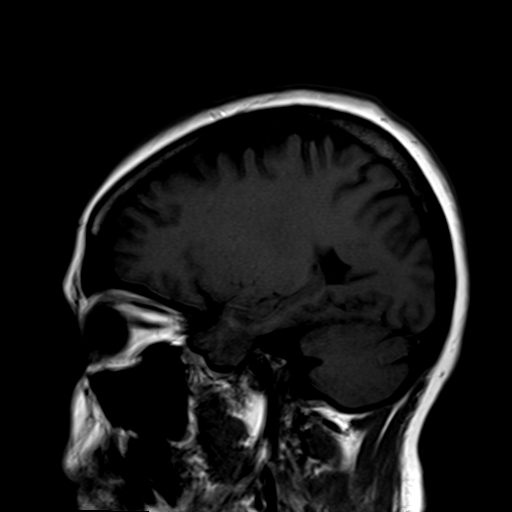
[im 15/23]
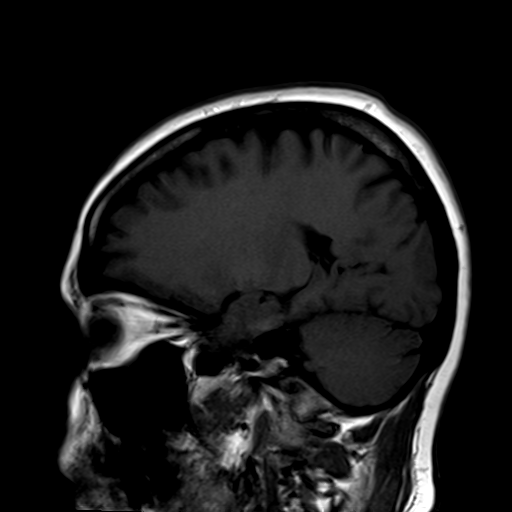
[im 23/23]
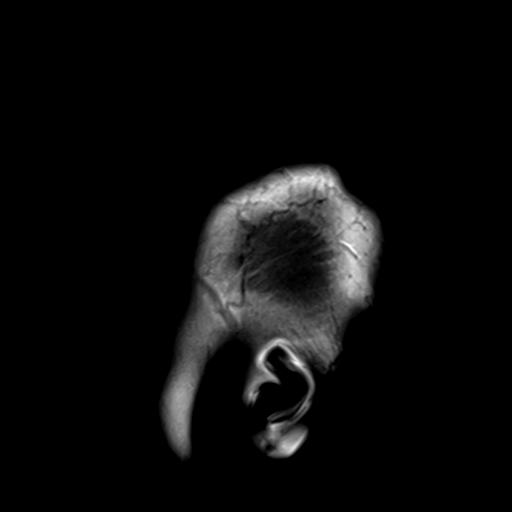

[Series 3: DWI · axial · 3.0mm · 1.80mm/px · z∈[-68,+78]mm · 11 of 100 slices shown (1 of 2)]
[im 1/100]
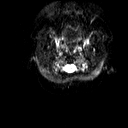
[im 10/100]
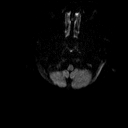
[im 20/100]
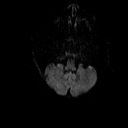
[im 30/100]
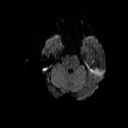
[im 40/100]
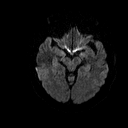
[im 50/100]
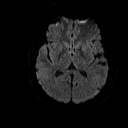
[im 60/100]
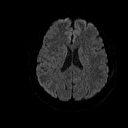
[im 70/100]
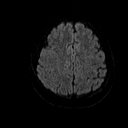
[im 80/100]
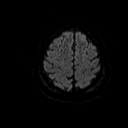
[im 90/100]
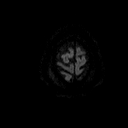
[im 100/100]
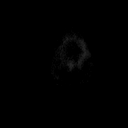

[Series 4: DWI · axial · 3.0mm · 1.80mm/px · z∈[-68,+78]mm · 6 of 50 slices shown (2 of 2)]
[im 1/50]
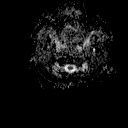
[im 10/50]
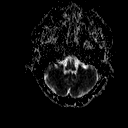
[im 20/50]
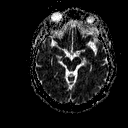
[im 30/50]
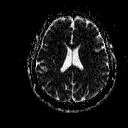
[im 40/50]
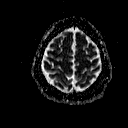
[im 50/50]
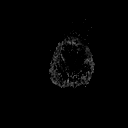

[Series 5: T2 · axial · 5.0mm · 0.45mm/px · z∈[-62,+81]mm · 3 of 23 slices shown]
[im 1/23]
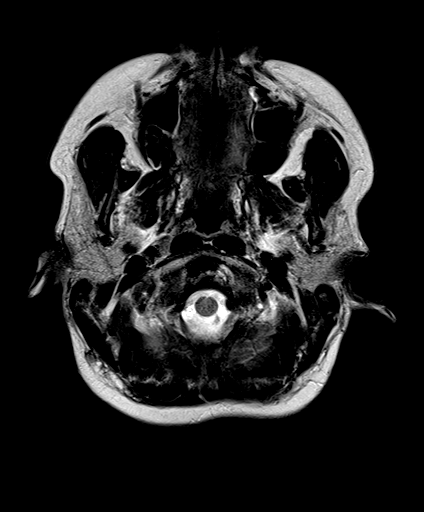
[im 12/23]
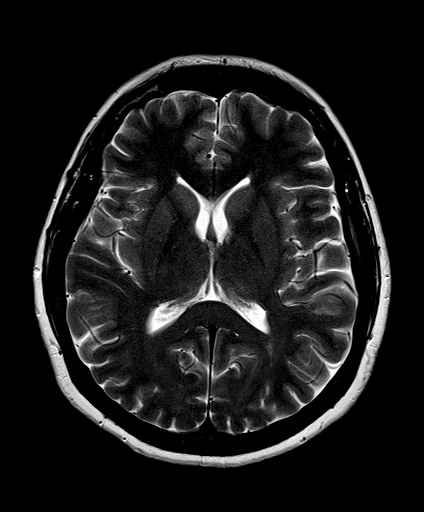
[im 23/23]
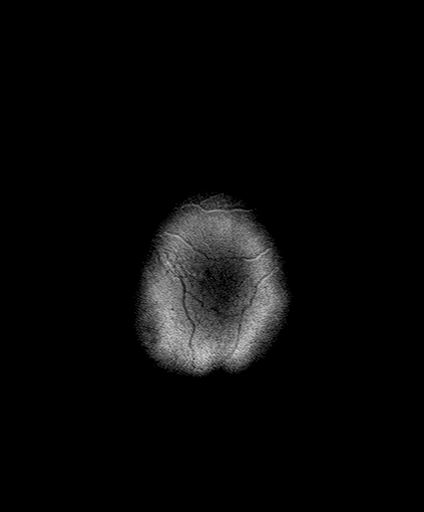

[Series 6: FLAIR · axial · 3.0mm · 0.45mm/px · z∈[-70,+85]mm · 3 of 27 slices shown]
[im 1/27]
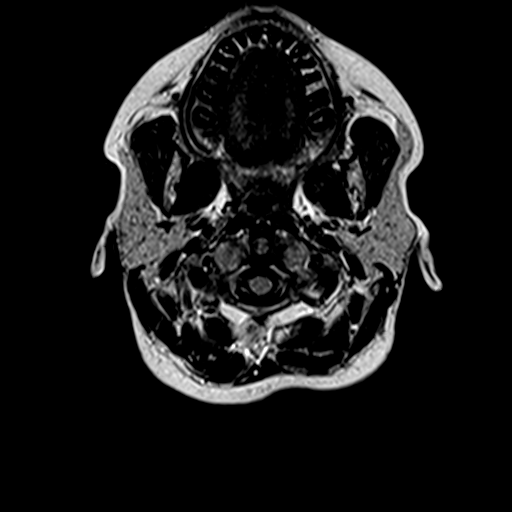
[im 14/27]
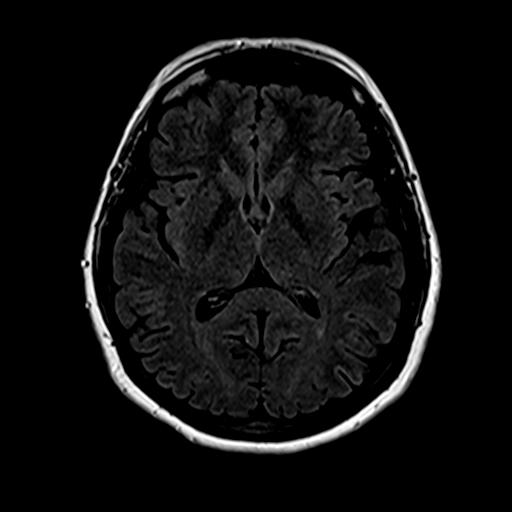
[im 27/27]
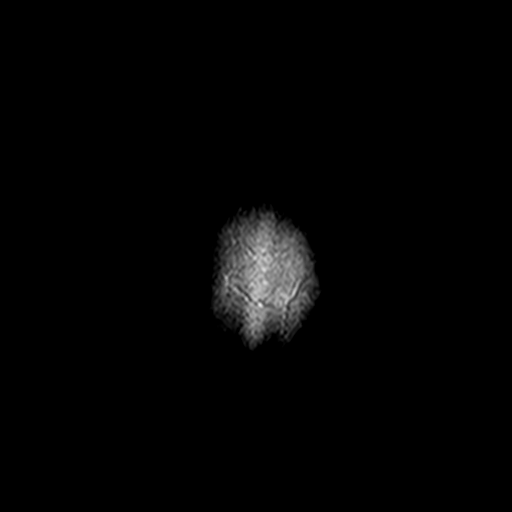

[Series 8: swi_images · axial · 3.0mm · 0.90mm/px · z∈[-61,+80]mm · 5 of 48 slices shown]
[im 1/48]
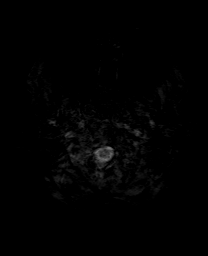
[im 12/48]
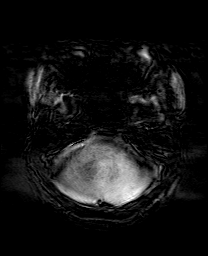
[im 24/48]
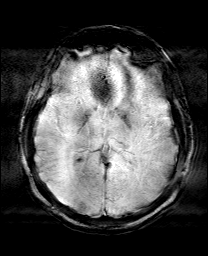
[im 36/48]
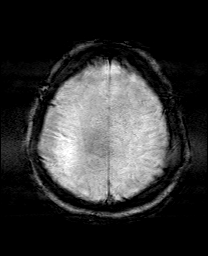
[im 48/48]
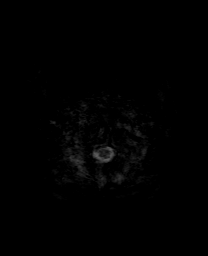

[Series 9: T1 · coronal · 3.0mm · 0.35mm/px · 1 of 11 slices shown (2 of 3)]
[im 1/11]
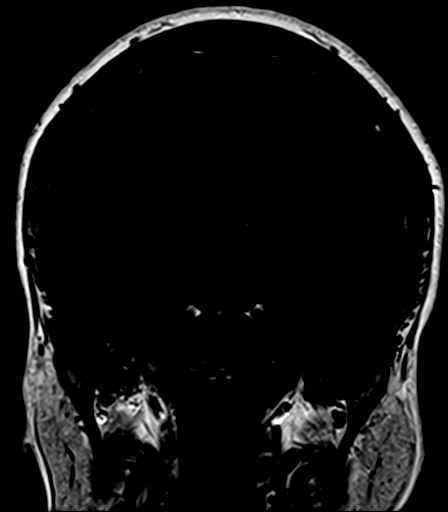

[Series 10: T1 · axial · 3.0mm · 0.35mm/px · 1 of 11 slices shown (3 of 3)]
[im 1/11]
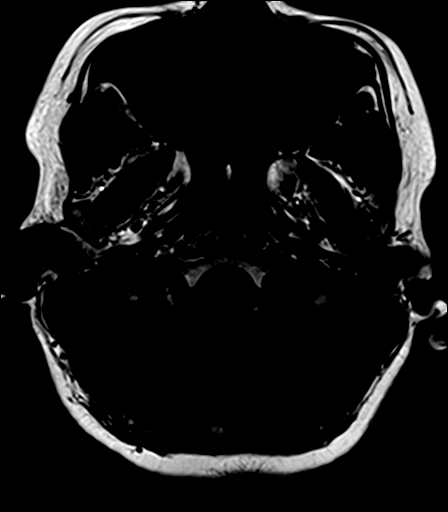

[Series 11: bSSFP · axial · 1.0mm · 0.28mm/px · z∈[-47,-36]mm · 2 of 36 slices shown]
[im 1/36]
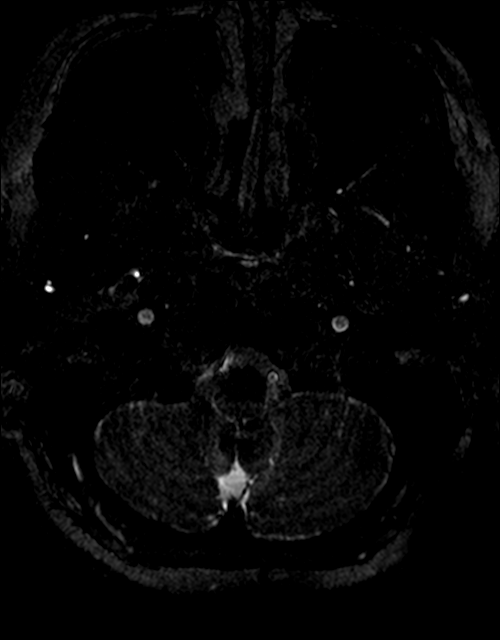
[im 12/36]
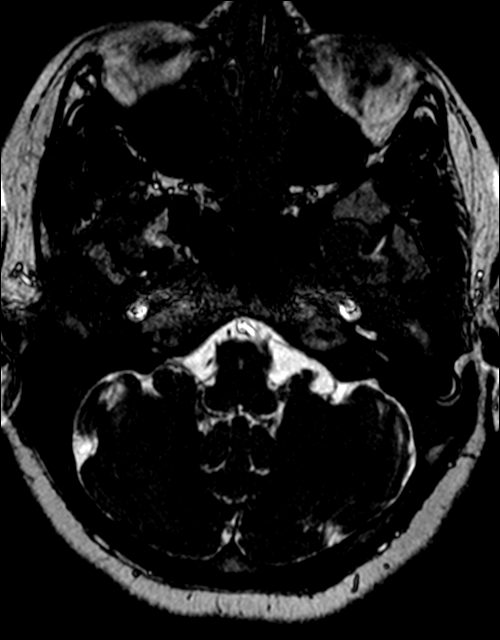

[Series 12: T1 post-contrast · coronal · 3.0mm · 0.35mm/px · 1 of 11 slices shown (1 of 2)]
[im 1/11]
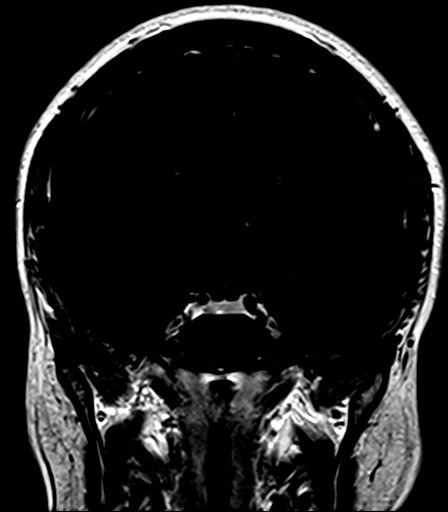

[Series 13: T1 post-contrast · axial · 3.0mm · 0.35mm/px · 1 of 11 slices shown (2 of 2)]
[im 1/11]
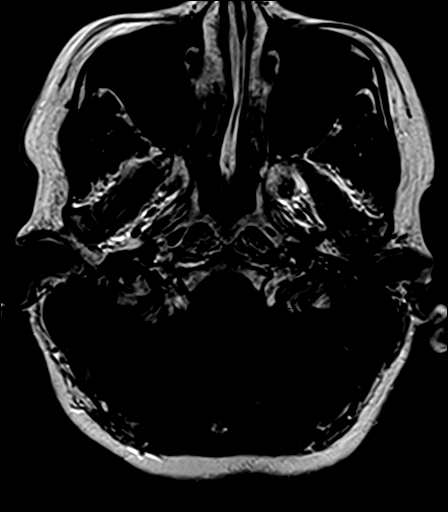

[38 of 48 positions shown; findings below may reference images not displayed]

FINDINGS: BRAIN: There is no acute infarct, acute hemorrhage or extra-axial
collection. The white matter signal is normal for the patient's age.
The cerebral and cerebellar volume are age-appropriate. There is no
hydrocephalus. The midline structures are normal.

INTERNAL AUDITORY CANALS: There is no cerebellopontine angle mass.
The cochleae and semicircular canals are normal. No focal
abnormality along the course of the 7th and 8th cranial nerves.
Normal porus acusticus and vestibular aqueduct bilaterally.

VASCULAR: The major intracranial arterial and venous sinus flow
voids are normal. Susceptibility-sensitive sequences show no chronic
microhemorrhage or superficial siderosis.

SKULL AND UPPER CERVICAL SPINE: Calvarial bone marrow signal is
normal. There is no skull base mass. The visualized upper cervical
spine and soft tissues are normal.

SINUSES/ORBITS: There are no fluid levels or advanced mucosal
thickening. The mastoid air cells and middle ear cavities are free
of fluid. The orbits are normal.
IMPRESSION: Normal MRI of the brain and internal auditory canals.

## 2022-03-29 ENCOUNTER — Other Ambulatory Visit (HOSPITAL_BASED_OUTPATIENT_CLINIC_OR_DEPARTMENT_OTHER): Payer: Self-pay | Admitting: Family Medicine

## 2022-03-29 DIAGNOSIS — N938 Other specified abnormal uterine and vaginal bleeding: Secondary | ICD-10-CM

## 2022-11-14 ENCOUNTER — Ambulatory Visit: Payer: BC Managed Care – PPO | Admitting: Cardiology

## 2022-11-14 ENCOUNTER — Encounter: Payer: Self-pay | Admitting: Cardiology

## 2022-11-14 VITALS — BP 161/94 | HR 46 | Resp 16 | Ht 65.0 in | Wt 171.0 lb

## 2022-11-14 DIAGNOSIS — E7800 Pure hypercholesterolemia, unspecified: Secondary | ICD-10-CM

## 2022-11-14 DIAGNOSIS — R001 Bradycardia, unspecified: Secondary | ICD-10-CM

## 2022-11-14 DIAGNOSIS — I10 Essential (primary) hypertension: Secondary | ICD-10-CM

## 2022-11-14 MED ORDER — SPIRONOLACTONE-HCTZ 25-25 MG PO TABS: 1.0000 | ORAL_TABLET | ORAL | 2 refills | Status: DC

## 2022-11-14 NOTE — Progress Notes (Signed)
Primary Physician/Referring:  Koren Shiver, DO  Patient ID: Alicia Wilkins, female    DOB: [birth year], 47 y.o.   MRN: [id number]  Chief Complaint  Patient presents with   Low heartbeat   New Patient (Initial Visit)   HPI:    Alicia Wilkins  is a 47 y.o. Asian [place] female patient with hypertension presents to discuss asymptomatic sinus bradycardia accompanied by her husband.  She is completely asymptomatic but is worried about low heart rate noted on her smart watch.  Recently her medications for hypertension were discontinued due to low heart rate.  She was started on lisinopril.  Again no dizziness or syncope.  She has no family history of sudden cardiac death.  She exercises regularly by walking 2.5 miles or 3 miles on a daily basis and also uses treadmill if weather is bad outside.  She is trying to lose weight.  History reviewed. No pertinent past medical history. Past Surgical History:  Procedure Laterality Date   c-section     Family History  Problem Relation Age of Onset   Hypertension Mother    Diabetes Mother    Hypertension Father    Sudden Cardiac Death Maternal Grandfather     Social History   Tobacco Use   Smoking status: Never   Smokeless tobacco: Never  Substance Use Topics   Alcohol use: Not Currently   Marital Status: Married  ROS  Review of Systems  Cardiovascular:  Negative for chest pain, dyspnea on exertion and leg swelling.   Objective      11/14/2022    1:19 PM 02/18/2019    4:33 PM 01/31/2019   12:30 AM  Vitals with BMI  Height 5\' 5"  5\' 5"    Weight 171 lbs 178 lbs 3 oz   BMI 28.46 29.65   Systolic 161 128 782  Diastolic 94 88 97  Pulse 46 63 68   Blood pressure (!) 161/94, pulse (!) 46, resp. rate 16, height 5\' 5"  (1.651 m), weight 171 lb (77.6 kg), SpO2 96%.  Physical Exam Neck:     Vascular: No carotid bruit or JVD.  Cardiovascular:     Rate and Rhythm: Regular rhythm. Bradycardia present.      Pulses: Intact distal pulses.     Heart sounds: Normal heart sounds. No murmur heard.    No gallop.  Pulmonary:     Effort: Pulmonary effort is normal.     Breath sounds: Normal breath sounds.  Abdominal:     General: Bowel sounds are normal.     Palpations: Abdomen is soft.  Musculoskeletal:     Right lower leg: No edema.     Left lower leg: No edema.    Laboratory examination:   No results for input(s): "NA", "K", "CL", "CO2", "GLUCOSE", "BUN", "CREATININE", "CALCIUM", "GFRNONAA", "GFRAA" in the last 8760 hours.  Lab Results  Component Value Date   GLUCOSE 112 (H) 01/30/2019   NA 141 01/30/2019   K 3.8 01/30/2019   CL 107 01/30/2019   CO2 25 01/30/2019   BUN 9 01/30/2019   CREATININE 0.78 01/30/2019   GFRNONAA >60 01/30/2019   CALCIUM 9.3 01/30/2019   PROT 7.9 01/30/2019   ALBUMIN 4.1 01/30/2019   BILITOT 0.5 01/30/2019   ALKPHOS 74 01/30/2019   AST 25 01/30/2019   ALT 17 01/30/2019   ANIONGAP 9 01/30/2019   Lipid Panel No results for input(s): "CHOL", "TRIG", "LDLCALC", "VLDL", "HDL", "CHOLHDL", "LDLDIRECT" in the last 8760 hours.  Last vitamin  D No results found for: "25OHVITD2", "25OHVITD3", "VD25OH"  External labs:   Cholesterol, total 238.000 m 01/19/2020 HDL 54.000 mg 01/19/2020 LDL 143.000 m 01/19/2020 Triglycerides 226.000 m 01/19/2020  A1C 5.900 % 03/30/2021 TSH 1.320 10/23/2022  Hemoglobin 12.400 g/d 10/23/2022  Creatinine, Serum 0.770 mg/ 10/23/2022 Potassium 3.900 mm 10/23/2022 Magnesium 2.200 mg/ 10/23/2022 ALT (SGPT) 32.000 U/L 10/23/2022  Radiology:    Cardiac Studies:   Echocardiogram 02/26/2019: 1. Left ventricular ejection fraction, by visual estimation, is 60 to 65%. The left ventricle has normal function. There is no left ventricular hypertrophy.  2. Global right ventricle has normal systolic function.The right ventricular size is normal. No increase in right ventricular wall thickness.  3. Left atrial size was normal.  4. Right atrial  size was normal.  5. The mitral valve is normal in structure. Trace mitral valve regurgitation. No evidence of mitral stenosis.  6. The tricuspid valve is normal in structure. Tricuspid valve regurgitation is trivial.  7. The aortic valve is normal in structure. Aortic valve regurgitation is not visualized. No evidence of aortic valve sclerosis or stenosis.  8. The pulmonic valve was normal in structure. Pulmonic valve regurgitation is not visualized.  9. Mildly elevated pulmonary artery systolic pressure. 10. The inferior vena cava is normal in size with greater than 50% respiratory variability, suggesting right atrial pressure of 3 mmHg.  Zio patch monitoring 14 days starting 03/09/2019: Predominant rhythm is normal sinus rhythm.  Minimum heart rate 44 bpm at 3:58 AM, maximum heart beat 146 bpm at 9:21 AM with average heartbeat of 74 bpm. Occasional PACs and PVCs.  Ventricular bigeminy and trigeminy were present. 1 patient activated event of lightheadedness reveals normal sinus rhythm.  Depoe diary entry revealed normal sinus rhythm, average heart rate around 68 to 71 bpm.  EKG:   EKG 11/14/2022: Marked sinus bradycardia at the rate of 45 bpm otherwise normal EKG.    Medications and allergies  No Known Allergies  Medication list  No current outpatient medications on file.  Assessment     ICD-10-CM   1. Shortness of breath  R06.02 EKG 12-Lead       Orders Placed This Encounter  Procedures   EKG 12-Lead    No orders of the defined types were placed in this encounter.   Medications Discontinued During This Encounter  Medication Reason   ketoconazole (NIZORAL) 2 % cream    meclizine (ANTIVERT) 25 MG tablet    nystatin (MYCOSTATIN/NYSTOP) powder    ondansetron (ZOFRAN) 4 MG tablet    promethazine (PHENERGAN) 25 MG tablet      Recommendations:   Alicia Wilkins is a 47 y.o. Asian [place] female patient with hypertension presents to discuss asymptomatic sinus  bradycardia accompanied by her husband.  1. Primary hypertension Patient has had chronic sinus bradycardia, but she is completely asymptomatic.  I reviewed her echocardiogram and also extended EKG monitoring from 2020, no significant arrhythmias.  Patient exercises regularly, walks for 2.5 to 3 miles on a daily basis and on bad days outside, she walks on a treadmill without fail.  She has no decreased exercise tolerance, no chest pain, no dizziness or syncope.  Hence I simply reassured her that her heart rate is essentially normal and reassured the patient and her husband.  I am more concerned about her hypertension.  She discontinued some of the medications due to bradycardia although she was not having any symptoms from this.  I will obtain aldosterone to renin activity with ratio today to  exclude adrenal adenoma and I also discussed with her regarding NASH diet.  I will start her on Aldactazide 25/25 mg daily, will repeat BMP along with vitamin D and lipid profile testing in 3 weeks after starting the medication.  - EKG 12-Lead - Aldosterone + renin activity w/ ratio - spironolactone-hydrochlorothiazide (ALDACTAZIDE) 25-25 MG tablet; Take 1 tablet by mouth every morning.  Dispense: 30 tablet; Refill: 2 - Basic metabolic panel; Future - VITAMIN D 25 Hydroxy (Vit-D Deficiency, Fractures); Future  2. Asymptomatic bradycardia As dictated above, I have reassured her.  I will obtain ANA to exclude any suggestion for systemic sclerosis by doing ANA.  She has no telltale signs for any systemic sclerosis symptoms.  Previously noted dyspnea, is only when she has extremes of exertion and states that this is not changed over many years.  This is not limiting to her.  As she is completely asymptomatic, I do not recommend treadmill stress testing.  Heart rate variation that she noticed is probably physiologic.  - ANA  3. Pure hypercholesterolemia Patient's lipids reviewed, in 2021 she had marked  hyperlipidemia.  She will need lipid profile testing which will be performed in 3 weeks on a fasting state. - Lipid Panel With LDL/HDL Ratio; Future    Yates Decamp, MD, [facility] 11/14/2022, 1:35 PM Office: [phone]

## 2022-11-24 LAB — ALDOSTERONE + RENIN ACTIVITY W/ RATIO
Aldos/Renin Ratio: 35.5 — ABNORMAL HIGH (ref 0.0–30.0)
Aldosterone: 7.5 ng/dL (ref 0.0–30.0)
Renin Activity, Plasma: 0.211 ng/mL/h (ref 0.167–5.380)

## 2022-11-24 LAB — ANA: ANA Titer 1: NEGATIVE

## 2022-11-24 NOTE — Progress Notes (Signed)
Serum aldosterone: Renin ratio 11/24/2022: Minimally elevated at 35.5.

## 2022-12-06 ENCOUNTER — Other Ambulatory Visit: Payer: Self-pay | Admitting: Cardiology

## 2022-12-07 LAB — LIPID PANEL
Chol/HDL Ratio: 3.3 ratio (ref 0.0–4.4)
Cholesterol, Total: 173 mg/dL (ref 100–199)
HDL: 52 mg/dL (ref >39)
LDL Chol Calc (NIH): 102 mg/dL — ABNORMAL HIGH (ref 0–99)
Triglycerides: 102 mg/dL (ref 0–149)
VLDL Cholesterol Cal: 19 mg/dL (ref 5–40)

## 2022-12-07 LAB — BASIC METABOLIC PANEL
BUN/Creatinine Ratio: 17 (ref 9–23)
BUN: 13 mg/dL (ref 6–24)
CO2: 23 mmol/L (ref 20–29)
Calcium: 9.9 mg/dL (ref 8.7–10.2)
Chloride: 102 mmol/L (ref 96–106)
Creatinine, Ser: 0.76 mg/dL (ref 0.57–1.00)
Glucose: 95 mg/dL (ref 70–99)
Potassium: 4.1 mmol/L (ref 3.5–5.2)
Sodium: 141 mmol/L (ref 134–144)
eGFR: 97 mL/min/{1.73_m2} (ref >59)

## 2022-12-07 LAB — VITAMIN D 25 HYDROXY (VIT D DEFICIENCY, FRACTURES): Vit D, 25-Hydroxy: 70.2 ng/mL (ref 30.0–100.0)

## 2022-12-26 ENCOUNTER — Ambulatory Visit: Payer: BC Managed Care – PPO | Admitting: Cardiology

## 2023-01-09 ENCOUNTER — Ambulatory Visit: Payer: BC Managed Care – PPO | Attending: Cardiology | Admitting: Cardiology

## 2023-01-09 ENCOUNTER — Encounter: Payer: Self-pay | Admitting: Cardiology

## 2023-01-09 VITALS — BP 110/72 | HR 50 | Resp 16 | Ht 65.0 in | Wt 162.2 lb

## 2023-01-09 DIAGNOSIS — E7800 Pure hypercholesterolemia, unspecified: Secondary | ICD-10-CM

## 2023-01-09 DIAGNOSIS — R001 Bradycardia, unspecified: Secondary | ICD-10-CM | POA: Diagnosis not present

## 2023-01-09 DIAGNOSIS — I10 Essential (primary) hypertension: Secondary | ICD-10-CM

## 2023-01-09 LAB — BASIC METABOLIC PANEL
BUN/Creatinine Ratio: 19 (ref 9–23)
BUN: 15 mg/dL (ref 6–24)
CO2: 23 mmol/L (ref 20–29)
Calcium: 10.1 mg/dL (ref 8.7–10.2)
Chloride: 103 mmol/L (ref 96–106)
Creatinine, Ser: 0.78 mg/dL (ref 0.57–1.00)
Glucose: 87 mg/dL (ref 70–99)
Potassium: 4.2 mmol/L (ref 3.5–5.2)
Sodium: 142 mmol/L (ref 134–144)
eGFR: 94 mL/min/{1.73_m2} (ref >59)

## 2023-01-09 MED ORDER — SPIRONOLACTONE-HCTZ 25-25 MG PO TABS: 0.5000 | ORAL_TABLET | ORAL | 0 refills | Status: DC

## 2023-01-09 NOTE — Addendum Note (Signed)
Addended by: Delrae Rend on: 01/09/2023 06:40 PM   Modules accepted: Level of Service

## 2023-01-09 NOTE — Progress Notes (Signed)
Cardiology Office Note:  .   Date:  01/09/2023  ID:  Alanson Puls, DOB [birth year], MRN [id number] PCP: Koren Shiver, DO  [facility] Providers Cardiologist:  Yates Decamp, MD    History of Present Illness: .   Alicia Wilkins is a 47 y.o. H&N female patient with hypertension, hypercholesterolemia, hyperglycemia, asymptomatic chronic sinus bradycardia presents here for follow-up of hypertension and bradycardia.  She was last seen by me on 11/24/2022.  Discussed the use of AI scribe software for clinical note transcription with the patient, who gave verbal consent to proceed.  History of Present Illness   The patient, with a history of hypertension and hyperlipidemia, presents for a routine follow-up. She reports feeling well and has been adhering to her prescribed regimen. Her blood pressure is well controlled on her current medication, and her cholesterol levels have significantly improved. She has been engaging in regular physical activity, walking daily as previously advised. She has also been experiencing some tingling and numbness in her legs, the cause of which is currently unclear. She also reports occasional dizziness, particularly when standing up quickly.       Review of Systems  Cardiovascular:  Negative for chest pain, dyspnea on exertion and leg swelling.    Risk Assessment/Calculations:      Lab Results  Component Value Date   CHOL 173 12/06/2022   HDL 52 12/06/2022   LDLCALC 102 (H) 12/06/2022   TRIG 102 12/06/2022   CHOLHDL 3.3 12/06/2022   Lab Results  Component Value Date   NA 142 01/09/2023   K 4.2 01/09/2023   CO2 23 01/09/2023   GLUCOSE 87 01/09/2023   BUN 15 01/09/2023   CREATININE 0.78 01/09/2023   CALCIUM 10.1 01/09/2023   EGFR 94 01/09/2023   GFRNONAA >60 01/30/2019      Latest Ref Rng & Units 01/09/2023   10:15 AM 12/06/2022    8:21 AM 01/30/2019    7:45 PM  BMP  Glucose 70 - 99 mg/dL 87  95  865   BUN 6 -  24 mg/dL 15  13  9    Creatinine 0.57 - 1.00 mg/dL 7.84  6.96  2.95   BUN/Creat Ratio 9 - 23 19  17     Sodium 134 - 144 mmol/L 142  141  141   Potassium 3.5 - 5.2 mmol/L 4.2  4.1  3.8   Chloride 96 - 106 mmol/L 103  102  107   CO2 20 - 29 mmol/L 23  23  25    Calcium 8.7 - 10.2 mg/dL 28.4  9.9  9.3     Lab Results  Component Value Date   WBC 7.6 01/30/2019   HGB 11.2 (L) 01/30/2019   HCT 34.8 (L) 01/30/2019   MCV 85.9 01/30/2019   PLT 262 01/30/2019   Last vitamin D Lab Results  Component Value Date   VD25OH 70.2 12/06/2022    Serum aldosterone: Renin ratio 11/24/2022: Minimally elevated at 35.5.    External Labs:  A1C 5.900 % 03/30/2021 TSH 1.320 10/23/2022   Hemoglobin 12.400 g/d 10/23/2022   Creatinine, Serum 0.770 mg/ 10/23/2022 Potassium 3.900 mm 10/23/2022 Magnesium 2.200 mg/ 10/23/2022 ALT (SGPT) 32.000 U/L 10/23/2022  Cholesterol, total 238.000 m 01/19/2020 HDL 54.000 mg 01/19/2020 LDL 143.000 m 01/19/2020 Triglycerides 226.000 m 01/19/2020  Physical Exam:   VS:  BP 110/72 (BP Location: Left Arm, Patient Position: Sitting, Cuff Size: Normal)   Pulse (!) 50   Resp 16   Ht 5'  5" (1.651 m)   Wt 162 lb 3.2 oz (73.6 kg)   SpO2 98%   BMI 26.99 kg/m    Wt Readings from Last 3 Encounters:  01/09/23 162 lb 3.2 oz (73.6 kg)  11/14/22 171 lb (77.6 kg)  02/18/19 178 lb 3.2 oz (80.8 kg)     Physical Exam Neck:     Vascular: No carotid bruit or JVD.  Cardiovascular:     Rate and Rhythm: Normal rate and regular rhythm.     Pulses: Intact distal pulses.     Heart sounds: Normal heart sounds. No murmur heard.    No gallop.  Pulmonary:     Effort: Pulmonary effort is normal.     Breath sounds: Normal breath sounds.  Abdominal:     General: Bowel sounds are normal.     Palpations: Abdomen is soft.  Musculoskeletal:     Right lower leg: No edema.     Left lower leg: No edema.     Studies Reviewed: Marland Kitchen    EKG:        EKG 11/14/2022: Marked sinus bradycardia at  the rate of 45 bpm otherwise normal EKG   Echocardiogram 02/26/2019:  Normal LV systolic function, EF 60 to 65%. No significant valvular abnormality.  Zio patch monitoring 03/09/2019 for 14 days: Average heart rate 68 to 71 bpm.  Occasional PACs and PVCs.   ASSESSMENT AND PLAN: .      ICD-10-CM   1. Primary hypertension  I10 spironolactone-hydrochlorothiazide (ALDACTAZIDE) 25-25 MG tablet    Basic metabolic panel    2. Pure hypercholesterolemia  E78.00     3. Asymptomatic bradycardia  R00.1       Assessment and Plan    Hypertension Well controlled on current medication regimen. -Continue current antihypertensive medication. Check BMP today -Refill prescription for 90 days.  Hyperlipidemia Significant improvement in cholesterol levels, currently not requiring pharmacological intervention. -No indication for starting statin therapy for now -Add flax seed flakes to daily diet to further improve cholesterol levels.  General Health Maintenance -Stay well hydrated. -Consider adding a daily women's vitamin. -Order blood work today. -Follow up with primary care physician for routine care and medication refills.  Transient Dizziness Possible side effect of antihypertensive medication. -Monitor symptoms. -If dizziness worsens or patient feels like she is going to fall, consider reducing antihypertensive medication dosage.  Tingling in Legs Unclear etiology. -Consider adding a daily vitamin.  Follow-up as needed.  BMP from today reviewed, no change in renal function with Aldactazide, potassium levels normal.  Continue present medications.  Signed,  Yates Decamp, MD, [facility] 01/09/2023, 6:37 PM [facility] [address] [place], [place] [address] Phone: [phone]. Fax:  [phone]

## 2023-01-09 NOTE — Patient Instructions (Signed)
Medication Instructions:  Your physician recommends that you continue on your current medications as directed. Please refer to the Current Medication list given to you today.  *If you need a refill on your cardiac medications before your next appointment, please call your pharmacy*  Lab Work: TODAY: BMP If you have labs (blood work) drawn today and your tests are completely normal, you will receive your results only by: MyChart Message (if you have MyChart) OR A paper copy in the mail If you have any lab test that is abnormal or we need to change your treatment, we will call you to review the results.  Testing/Procedures: None ordered today.  Follow-Up: At [facility], you and your health needs are our priority.  As part of our continuing mission to provide you with exceptional heart care, we have created designated Provider Care Teams.  These Care Teams include your primary Cardiologist (physician) and Advanced Practice Providers (APPs -  Physician Assistants and Nurse Practitioners) who all work together to provide you with the care you need, when you need it.  We recommend signing up for the patient portal called "MyChart".  Sign up information is provided on this After Visit Summary.  MyChart is used to connect with patients for Virtual Visits (Telemedicine).  Patients are able to view lab/test results, encounter notes, upcoming appointments, etc.  Non-urgent messages can be sent to your provider as well.   To learn more about what you can do with MyChart, go to [email or web address].    Your next appointment:   As needed  The format for your next appointment:   In Person  Provider:   Yates Decamp, MD {

## 2023-02-10 ENCOUNTER — Other Ambulatory Visit: Payer: Self-pay | Admitting: Cardiology

## 2023-02-10 DIAGNOSIS — I10 Essential (primary) hypertension: Secondary | ICD-10-CM

## 2023-05-07 ENCOUNTER — Ambulatory Visit: Payer: Commercial Managed Care - PPO | Admitting: Cardiology

## 2023-07-31 ENCOUNTER — Other Ambulatory Visit: Payer: Self-pay | Admitting: Family Medicine

## 2023-07-31 DIAGNOSIS — M4310 Spondylolisthesis, site unspecified: Secondary | ICD-10-CM

## 2023-07-31 DIAGNOSIS — G8929 Other chronic pain: Secondary | ICD-10-CM

## 2023-07-31 DIAGNOSIS — M51379 Other intervertebral disc degeneration, lumbosacral region without mention of lumbar back pain or lower extremity pain: Secondary | ICD-10-CM

## 2023-07-31 DIAGNOSIS — M5459 Other low back pain: Secondary | ICD-10-CM

## 2023-08-09 ENCOUNTER — Ambulatory Visit: Attending: Cardiology | Admitting: Cardiology

## 2023-08-12 ENCOUNTER — Encounter: Payer: Self-pay | Admitting: Cardiology
